# Patient Record
Sex: Female | Born: 1965 | Race: Black or African American | Hispanic: No | Marital: Married
Health system: Southern US, Community
[De-identification: ages and names within clinical notes are randomized; demographics above are authoritative.]

---

## 2018-02-21 ENCOUNTER — Emergency Department (HOSPITAL_COMMUNITY): Payer: 59

## 2018-02-21 ENCOUNTER — Emergency Department (HOSPITAL_COMMUNITY)
Admission: EM | Admit: 2018-02-21 | Discharge: 2018-02-21 | Disposition: A | Discharge status: Home / Self Care | Payer: 59 | Attending: Emergency Medicine | Admitting: Emergency Medicine

## 2018-02-21 ENCOUNTER — Other Ambulatory Visit: Payer: Self-pay

## 2018-02-21 ENCOUNTER — Encounter (HOSPITAL_COMMUNITY): Payer: Self-pay | Admitting: Radiology

## 2018-02-21 DIAGNOSIS — R109 Unspecified abdominal pain: Secondary | ICD-10-CM | POA: Diagnosis present

## 2018-02-21 LAB — CBC WITH DIFFERENTIAL/PLATELET
ABS IMMATURE GRANULOCYTES: 0.02 10*3/uL (ref 0.00–0.07)
BASOS PCT: 0 %
Basophils Absolute: 0 10*3/uL (ref 0.0–0.1)
Eosinophils Absolute: 0.3 10*3/uL (ref 0.0–0.5)
Eosinophils Relative: 4 %
HEMATOCRIT: 41.1 % (ref 36.0–46.0)
HEMOGLOBIN: 13 g/dL (ref 12.0–15.0)
IMMATURE GRANULOCYTES: 0 %
LYMPHS ABS: 2 10*3/uL (ref 0.7–4.0)
Lymphocytes Relative: 29 %
MCH: 29 pg (ref 26.0–34.0)
MCHC: 31.6 g/dL (ref 30.0–36.0)
MCV: 91.7 fL (ref 80.0–100.0)
MONO ABS: 0.5 10*3/uL (ref 0.1–1.0)
MONOS PCT: 8 %
NEUTROS ABS: 4 10*3/uL (ref 1.7–7.7)
Neutrophils Relative %: 59 %
PLATELETS: 243 10*3/uL (ref 150–400)
RBC: 4.48 MIL/uL (ref 3.87–5.11)
RDW: 11.9 % (ref 11.5–15.5)
WBC: 6.8 10*3/uL (ref 4.0–10.5)
nRBC: 0 % (ref 0.0–0.2)

## 2018-02-21 LAB — URINALYSIS, ROUTINE W REFLEX MICROSCOPIC
Bilirubin Urine: NEGATIVE
GLUCOSE, UA: NEGATIVE mg/dL
HGB URINE DIPSTICK: NEGATIVE
Ketones, ur: 20 mg/dL — AB
LEUKOCYTES UA: NEGATIVE
NITRITE: NEGATIVE
PH: 5 (ref 5.0–8.0)
Protein, ur: 30 mg/dL — AB
Specific Gravity, Urine: 1.025 (ref 1.005–1.030)

## 2018-02-21 LAB — BASIC METABOLIC PANEL
ANION GAP: 6 (ref 5–15)
BUN: 16 mg/dL (ref 6–20)
CHLORIDE: 110 mmol/L (ref 98–111)
CO2: 24 mmol/L (ref 22–32)
Calcium: 8.9 mg/dL (ref 8.9–10.3)
Creatinine, Ser: 0.89 mg/dL (ref 0.44–1.00)
GFR calc Af Amer: >60 mL/min (ref >60)
GFR calc non Af Amer: >60 mL/min (ref >60)
GLUCOSE: 76 mg/dL (ref 70–99)
POTASSIUM: 3.7 mmol/L (ref 3.5–5.1)
Sodium: 140 mmol/L (ref 135–145)

## 2018-02-21 LAB — HCG, QUANTITATIVE, PREGNANCY: hCG, Beta Chain, Quant, S: 2 m[IU]/mL (ref <5)

## 2018-02-21 MED ORDER — SODIUM CHLORIDE 0.9 % IV BOLUS
1000.0000 mL | Freq: Once | INTRAVENOUS | Status: AC
  Administered 2018-02-21: 1000 mL via INTRAVENOUS

## 2018-02-21 NOTE — ED Notes (Signed)
Ambulated to BR, made aware of delay with CT

## 2018-02-21 NOTE — ED Notes (Signed)
To CT

## 2018-02-21 NOTE — ED Triage Notes (Signed)
Pt reports feeling "kidney stone" symptoms at 0500. Pt reports nausea, faint, and flank pain.

## 2018-02-21 NOTE — ED Notes (Signed)
Waiting for discharge orders.

## 2018-02-21 NOTE — ED Notes (Signed)
Returned from CT.

## 2018-02-21 NOTE — ED Notes (Signed)
Urine culture sent down to lab  

## 2018-02-21 NOTE — ED Provider Notes (Signed)
[facility]-EMERGENCY DEPT Provider Note   CSN: [id number] Arrival date & time: 02/21/18  5284     History   Chief Complaint Chief Complaint  Patient presents with  . Flank Pain    HPI Tiffany Fowler is a 52 y.o. female presenting for left-sided flank pain that occurred approximately 1 hour prior to arrival.  Patient states that she was sleeping when she was awoken with a sharp left-sided flank pain that she states was 10/10 in severity. Patient states that this pain was accompanied by severe nausea however no vomiting. Patient states that this pain lasted approximately 5 minutes before resolving on its own.  Patient endorses total relief of pain, states that she is denying any pain at this time and feels well.  Patient states that she has a history of kidney stones and that this pain feels similar, states that her last stone was approximately 10 years ago.  Patient states that aside from history of nephrolithiasis and arthritis that she is an otherwise healthy female without other chronic medical conditions.  HPI  History reviewed. No pertinent past medical history.  There are no active problems to display for this patient.     OB History   None      Home Medications    Prior to Admission medications   Medication Sig Start Date End Date Taking? Authorizing Provider  aspirin 325 MG EC tablet Take 325 mg by mouth daily as needed (travel to prevent blood clots).   Yes [provider]  Cholecalciferol (VITAMIN D3) 5000 units CAPS Take 5,000 Units by mouth daily.   Yes [provider]  ibuprofen (ADVIL,MOTRIN) 200 MG tablet Take 400 mg by mouth 2 (two) times daily as needed for moderate pain.   Yes [provider]    Family History No family history on file.  Social History Social History   Tobacco Use  . Smoking status: Not on file  Substance Use Topics  . Alcohol use: Not on file  . Drug use: Not on file      Allergies   Patient has no known allergies.   Review of Systems Review of Systems  Constitutional: Negative.  Negative for chills and fever.  HENT: Negative.  Negative for rhinorrhea and sore throat.   Eyes: Negative.  Negative for visual disturbance.  Respiratory: Negative.  Negative for shortness of breath.   Cardiovascular: Negative.  Negative for chest pain.  Gastrointestinal: Positive for nausea. Negative for abdominal pain, diarrhea and vomiting.  Genitourinary: Positive for flank pain. Negative for dysuria and hematuria.  Musculoskeletal: Negative for arthralgias and myalgias.  Skin: Negative.  Negative for rash.  Neurological: Negative.  Negative for dizziness, weakness and headaches.   Physical Exam Updated Vital Signs BP 126/79 Comment: Resp 16  Pulse 81 Comment: Resp 16  Temp 97.6 F (36.4 C) (Oral)   Resp 20   Ht 5\' 8"  (1.727 m)   Wt 96.6 kg   LMP 11/11/2017 Comment: negative HCG blood test 02-21-2018  SpO2 98% Comment: Resp 16  BMI 32.39 kg/m   Physical Exam  Constitutional: She appears well-developed and well-nourished. No distress.  HENT:  Head: Normocephalic and atraumatic.  Right Ear: External ear normal.  Left Ear: External ear normal.  Nose: Nose normal.  Mouth/Throat: Uvula is midline, oropharynx is clear and moist and mucous membranes are normal.  Eyes: Pupils are equal, round, and reactive to light. EOM are normal.  Neck: Trachea normal, normal range of motion, full passive  range of motion without pain and phonation normal. Neck supple. No tracheal deviation present.  Cardiovascular: Normal rate, regular rhythm, normal heart sounds and intact distal pulses.  Pulses:      Dorsalis pedis pulses are 2+ on the right side, and 2+ on the left side.       Posterior tibial pulses are 2+ on the right side, and 2+ on the left side.  Pulmonary/Chest: Effort normal and breath sounds normal. No respiratory distress. She exhibits no tenderness, no crepitus  and no deformity.  Abdominal: Soft. Bowel sounds are normal. There is no tenderness. There is no rigidity, no rebound, no guarding and no CVA tenderness.  Musculoskeletal: Normal range of motion.       Right lower leg: Normal.       Left lower leg: Normal.  Neurological: She is alert. No sensory deficit. GCS eye subscore is 4. GCS verbal subscore is 5. GCS motor subscore is 6.  Speech is clear and goal oriented, follows commands Major Cranial nerves without deficit, no facial droop Normal strength in upper and lower extremities bilaterally including dorsiflexion and plantar flexion, strong and equal grip strength Sensation normal to light and sharp touch Moves extremities without ataxia, coordination intact Normal gait  Skin: Skin is warm and dry. Capillary refill takes less than 2 seconds.  Psychiatric: She has a normal mood and affect. Her behavior is normal.   ED Treatments / Results  Labs (all labs ordered are listed, but only abnormal results are displayed) Labs Reviewed  URINALYSIS, ROUTINE W REFLEX MICROSCOPIC - Abnormal; Notable for the following components:      Result Value   APPearance HAZY (*)    Ketones, ur 20 (*)    Protein, ur 30 (*)    Bacteria, UA FEW (*)    All other components within normal limits  URINE CULTURE  CBC WITH DIFFERENTIAL/PLATELET  BASIC METABOLIC PANEL  HCG, QUANTITATIVE, PREGNANCY    EKG None  Radiology Ct Renal Stone Study  Result Date: 02/21/2018 CLINICAL DATA:  Nausea and flank pain. EXAM: CT ABDOMEN AND PELVIS WITHOUT CONTRAST TECHNIQUE: Multidetector CT imaging of the abdomen and pelvis was performed following the standard protocol without IV contrast. COMPARISON:  None. FINDINGS: Lower chest: No acute abnormality. Hepatobiliary: No focal liver abnormality is seen. No gallstones, gallbladder wall thickening, or biliary dilatation. Pancreas: Unremarkable. No pancreatic ductal dilatation or surrounding inflammatory changes. Spleen: Normal in  size without focal abnormality. Adrenals/Urinary Tract: Adrenal glands are unremarkable. Kidneys are normal, without renal calculi, focal lesion, or hydronephrosis. Bladder is under distended. Stomach/Bowel: Stomach is within normal limits. Appendix appears normal. No evidence of bowel wall thickening, distention, or inflammatory changes. Mild sigmoid diverticulosis. Epiploic appendage arising from the descending colon without significant surrounding inflammatory changes. Vascular/Lymphatic: No significant vascular findings are present. No enlarged abdominal or pelvic lymph nodes. Reproductive: Uterus and bilateral adnexa are unremarkable. Other: Small fat containing umbilical hernia. No free fluid or pneumoperitoneum. Musculoskeletal: No acute or significant osseous findings. Small Schmorl's nodes involving the inferior L3 and superior L5 endplates. IMPRESSION: 1.  No acute intra-abdominal process.  No urolithiasis. Electronically Signed   By: Obie Dredge M.D.   On: 02/21/2018 08:54    Procedures Procedures (including critical care time)  Medications Ordered in ED Medications  sodium chloride 0.9 % bolus 1,000 mL (0 mLs Intravenous Stopped 02/21/18 0730)     Initial Impression / Assessment and Plan / ED Course  I have reviewed the triage vital signs and the  nursing notes.  Pertinent labs & imaging results that were available during my care of the patient were reviewed by me and considered in my medical decision making (see chart for details).  Clinical Course as of Feb 21 914  Mon Feb 21, 2018  1610 Patient reassessed, sitting comfortably and in no acute distress.  Patient denies recurrence of pain since 5AM.   [BM]  0907 Discussed case with Dr. Freida Busman.  Agrees that patient can be discharged with primary outpatient follow-up at this time.   [BM]    Clinical Course User Index [BM] Bill Salinas, PA-C   52 year old female presenting for 5 minutes of left flank pain and nausea that  occurred at 5 AM.  Beta hCG negative BMP within normal limits CBC within normal limits Urinalysis with some ketones and protein, few bacteria, no urinary symptoms doubt UTI at this time.  Has been sent for urine culture.  Likely dehydration, patient states that she has been working a lot recently and drinking less water.  Fluid bolus given in emergency department. CT renal stone study: IMPRESSION: 1.  No acute intra-abdominal process.  No urolithiasis.  Afebrile, not tachycardic, not hypotensive, well-appearing in no acute distress.  Patient sitting comfortably no acute distress.  Patient denies recurrence of pain since 5 AM.  Patient states that she does not require pain or nausea medicine today and that she will follow-up with her primary care provider as soon as possible.  At this time there does not appear to be any evidence of an acute emergency medical condition and the patient appears stable for discharge with appropriate outpatient follow up. Diagnosis was discussed with patient who verbalizes understanding of care plan and is agreeable to discharge. I have discussed return precautions with patient who verbalizes understanding of return precautions. Patient strongly encouraged to follow-up with their PCP. All questions answered.  Patient's case discussed with Dr. Freida Busman who agrees with plan to discharge with follow-up.   Note: Portions of this report may have been transcribed using voice recognition software. Every effort was made to ensure accuracy; however, inadvertent computerized transcription errors may still be present.  Final Clinical Impressions(s) / ED Diagnoses   Final diagnoses:  Left flank pain    ED Discharge Orders    None       Elizabeth Palau 02/21/18 9604    Derwood Kaplan, MD 02/21/18 2342

## 2018-02-21 NOTE — Discharge Instructions (Addendum)
Please return to the Emergency Department for any new or worsening symptoms or if your symptoms do not improve. Please be sure to follow up with your Primary Care Physician as soon as possible regarding your visit today. If you do not have a Primary Doctor please use the resources below to establish one. Your CT scan today did not show signs of kidney stone or other acute processes.  Please follow-up with your primary care provider as soon as possible for further evaluation.  Contact a health care provider if: Your pain is not controlled with medicine. You have new symptoms. Your pain gets worse. You have a fever. Your symptoms last longer than 2-3 days. You have trouble urinating or you are urinating very frequently. Get help right away if: You have trouble breathing or you are short of breath. Your pain returns Your abdomen hurts or it is swollen or red. You have nausea or vomiting. You feel faint or you pass out. You have blood in your urine.  Do not take your medicine if  develop an itchy rash, swelling in your mouth or lips, or difficulty breathing.   RESOURCE GUIDE  Chronic Pain Problems: Contact [facility]  [phone] Patients need to be referred by their primary care doctor.  Insufficient Money for Medicine: Contact United Way:  call "211" or [facility] 928-397-1197.  No Primary Care Doctor: Call [facility]  [phone] - can help you locate a primary care doctor that  accepts your insurance, provides certain services, etc. Physician Referral Service- [phone]  Agencies that provide inexpensive medical care: [facility]  [phone] [facility]  [phone] [facility]  [phone] [facility]  [phone] Planned Parenthood  [phone] [facility]  [phone]  Medicaid-accepting [facility] Providers: [facility]- [address]  [phone], Mon-Fri  9am-7pm, Sat 9am-1pm [facility]- [address] [place], [address] [place]  [phone] [facility]- [address], Suite [place]  [phone] [facility]- [address]  [phone] Renaye Rakers- [address] [place], [address], [phone]  Only accepts [place] patients after they have their name  applied to their card  Self Pay (no insurance) in [facility]: Sickle Cell Patients: Dr Willey Blade, [facility] Internal Medicine  [address] [place], [phone] [facility]- [address] [place]  [phone]       [facility]- [address]       -     [facility]- see information above (Speak to [employer] if you do not have insurance)       -  [facility]- [address] [place], [phone]       -  [facility]- [address],  [phone]       -  [facility]- [address] [phone]       -  Dr Julio Sicks-  [address], [address], [address], [phone]       -  [facility]- [address], [phone]       -  [facility]- [address], [phone], also [address] [phone]       -    [facility]- [address] [place], [phone], 1st & 3rd Saturday  every month, 10am-1pm  1) Find a Doctor and Pay Out of Pocket Although you won't have to find out who is covered by your insurance plan, it is a good idea to ask around and get recommendations. You will then need to call the office and see if the doctor you have chosen will accept you as a new patient and what types of options they offer for patients who are self-pay. Some doctors offer discounts or will set up payment plans for their patients who do not have insurance, but you will need to ask so you aren't surprised when you get to your appointment.  2) Contact Your Local Health Department Not all health departments have doctors that can see patients for  sick visits, but many do, so it is worth a call to see if yours does. If you don't know where your local health department is, you can check in your phone book. The CDC also has a tool to help you locate your state's health department, and many state websites also have listings of all of their local health departments.  3) Find a Walk-in Clinic If your illness is not likely to be very severe or complicated, you may want to try a walk in clinic. These are popping up all over the country in pharmacies, drugstores, and shopping centers. They're usually staffed by nurse practitioners or physician assistants that have been trained to treat common illnesses and complaints. They're usually fairly quick and inexpensive. However, if you have serious medical issues or chronic medical problems, these are probably not your best option  STD Testing [facility] Department of [facility] [place], STD Clinic, [address] [place], phone [phone] or [phone].  Monday - Friday, call for an appointment. [facility] Department of [employer], STD Clinic, [place] [address], [place], phone [phone] or [phone].  Monday - Friday, call for an appointment.  Abuse/Neglect: [facility] Child Abuse Hotline 515 146 9508 [facility] Child Abuse Hotline 661 586 7004 (After Hours)  Emergency Shelter:  [facility] 910-861-2294  Maternity Homes: Room at the [place] [facility] 306-707-8051 [facility] [phone]  MRSA Hotline #:   (609)455-0699  [facility] Resources  [facility] [place]  United Way [facility] Dept. [address]                 [address]         [address] [place] [address]  Blondell Reveal Phone:  [phone]                                  Phone:  [phone]                   Phone:   [phone]  [facility], [phone] [phone]       -     [facility] in  [place] [address],                                  [phone], Insurance  [place] Child Abuse Hotline (580)598-5768 or 442-095-1386 (After Hours)   Behavioral Health Services  Substance Abuse Resources: [facility]  4306123907 [facility] 3612680668 [facility] [phone] Floydene Flock [phone] Residential & Outpatient Substance Abuse Program  [phone]  Psychological Services: [facility]  [phone] [facility] Services  [phone] [facility], [phone] [place]. [address] [place], ACCESS LINE: 713-471-9128 or [phone], [employer]  Dental Assistance  If unable to pay or uninsured, contact:  [facility] or [facility]. to become qualified for the adult dental clinic.  Patients with Medicaid: [facility] [phone] [address] Quails, [phone] [address] [phone]  If unable to pay, or uninsured, contact [facility] [phone]) or [facility] Department [phone] in [place], [phone] in [facility]) to become qualified for the adult dental clinic   Other Low-Cost Community Dental Services: [facility]- [address] [place], [place], [place], [address], [phone], Ext. [phone], 2nd and 4th Thursday of the month at 6:30am.  10 clients each day by appointment, can sometimes see walk-in patients if someone does not show for an appointment. [facility]- [address] [place], [place], [address], [phone] [facility] [address] [place], [place], [address], [phone] [facility] Health Department- [phone] [facility] Health Department- [phone] [facility] [phone]

## 2018-02-23 LAB — URINE CULTURE

## 2020-05-11 IMAGING — CT CT RENAL STONE PROTOCOL
2 of 4 series · 16 of 46 positions shown, 18 images · non-contrast
Comparison: None.

CLINICAL DATA: Nausea and flank pain.

EXAM:
CT ABDOMEN AND PELVIS WITHOUT CONTRAST
TECHNIQUE: Multidetector CT imaging of the abdomen and pelvis was performed
following the standard protocol without IV contrast.

[Series 2: axial st · axial · 0.82mm/px · z∈[-506,-81]mm · 13 of 97 slices shown, 15 images]
[im 6/97  soft-tissue]
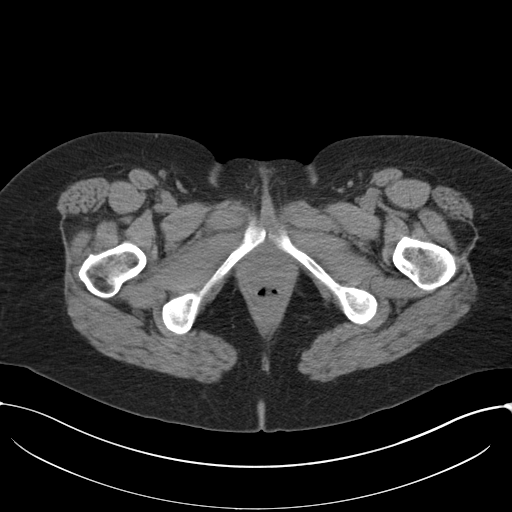
[im 6/97  bone]
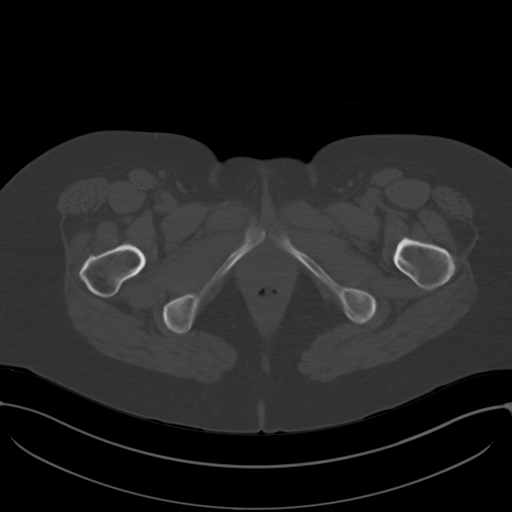
[im 12/97  soft-tissue]
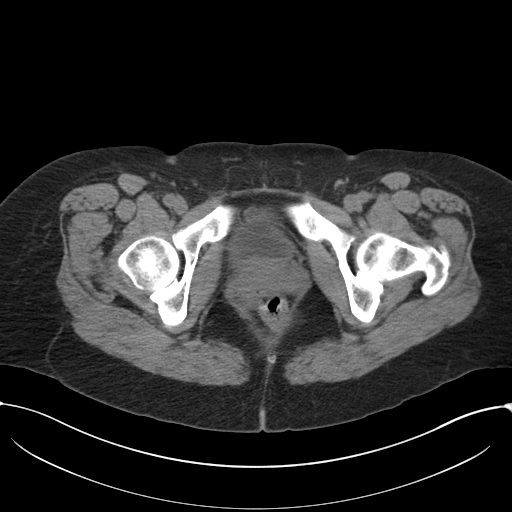
[im 23/97  soft-tissue]
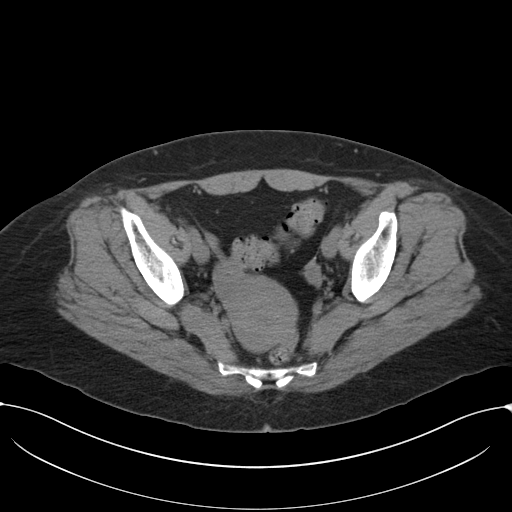
[im 29/97  soft-tissue]
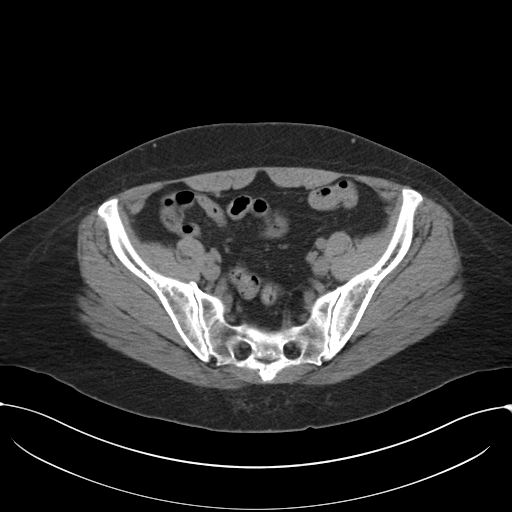
[im 34/97  soft-tissue]
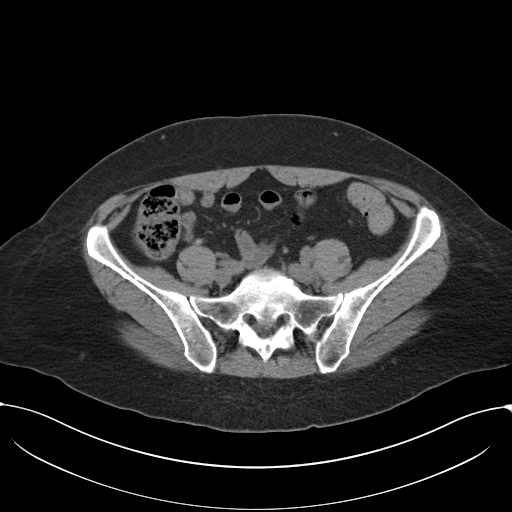
[im 40/97  soft-tissue]
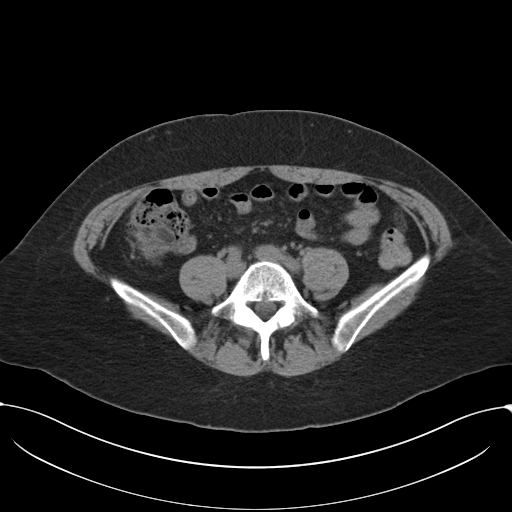
[im 51/97  soft-tissue]
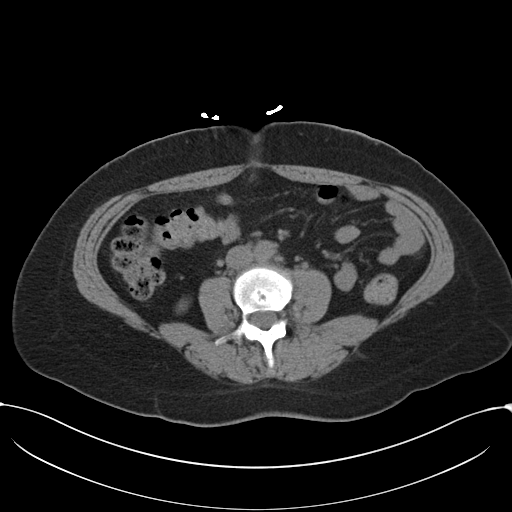
[im 57/97  soft-tissue]
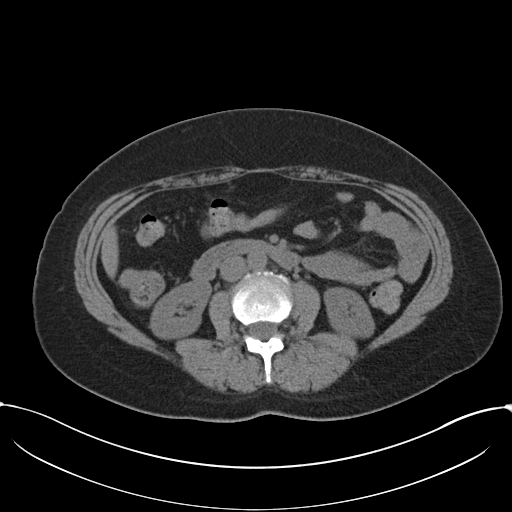
[im 63/97  soft-tissue]
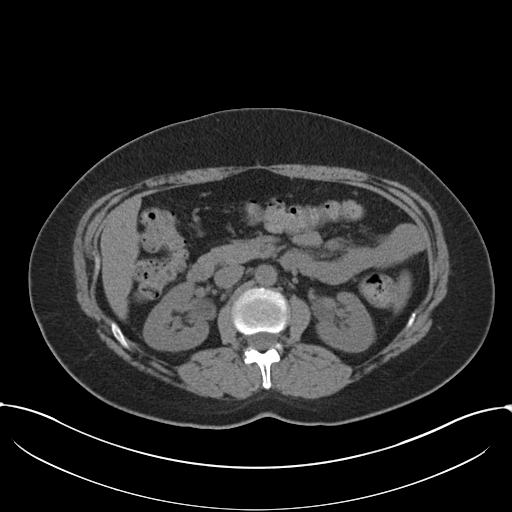
[im 63/97  bone]
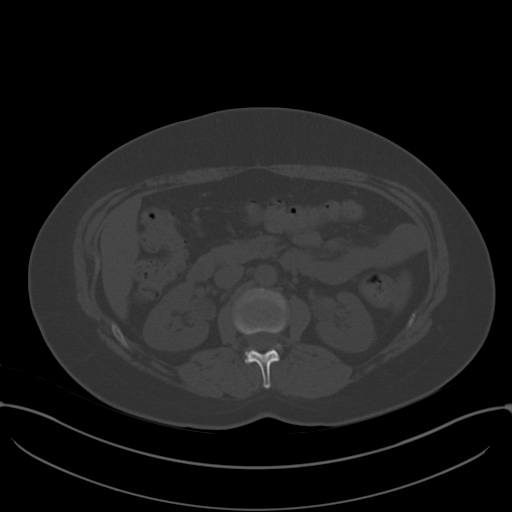
[im 68/97  soft-tissue]
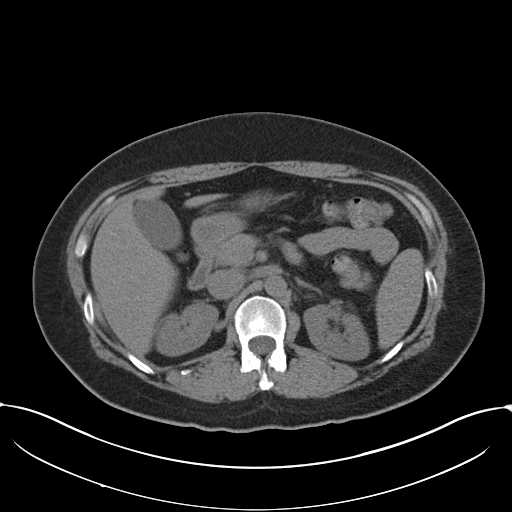
[im 74/97  soft-tissue]
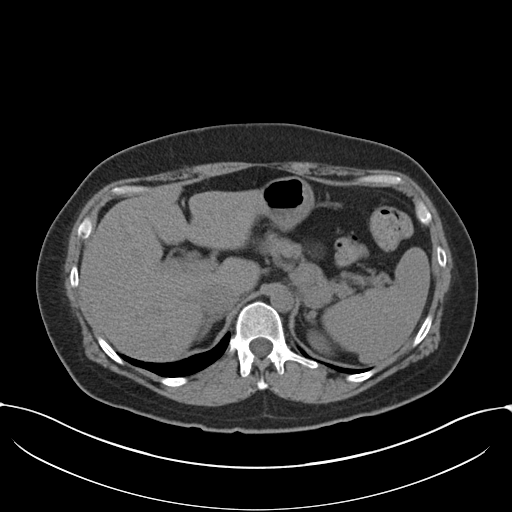
[im 85/97  soft-tissue]
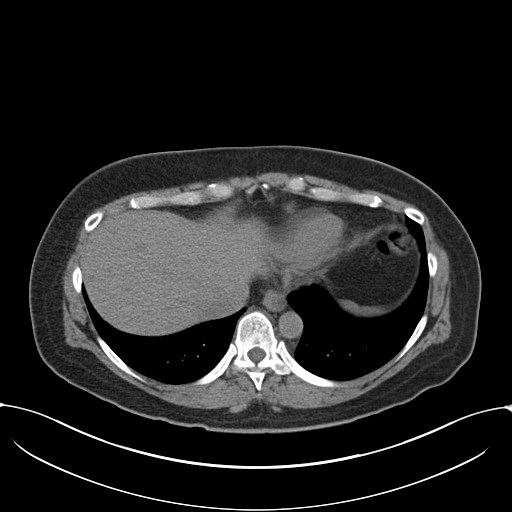
[im 91/97  soft-tissue]
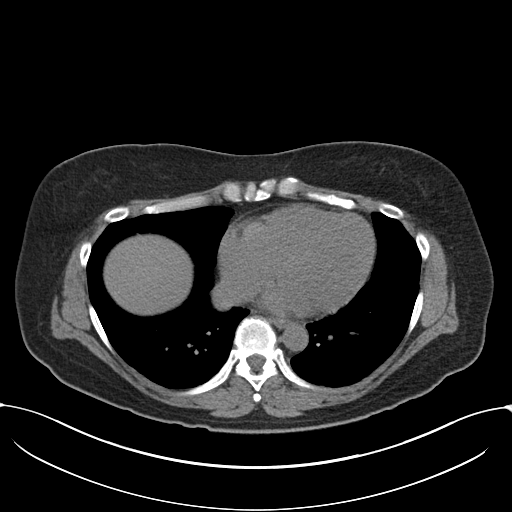

[Series 4: coronal · coronal · 0.82mm/px · 3 of 154 slices shown]
[im 52/154  soft-tissue]
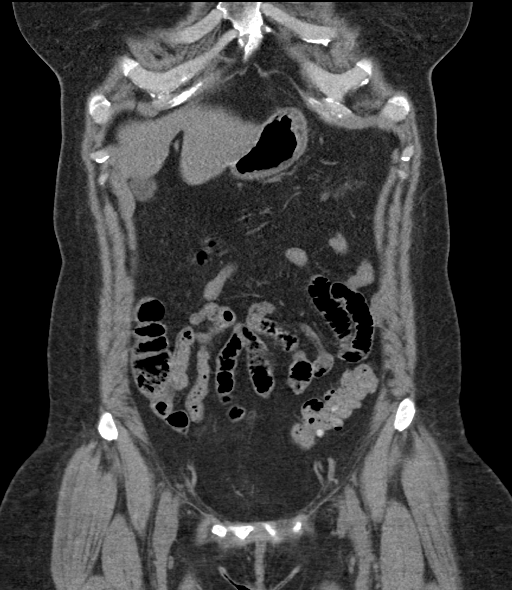
[im 69/154  soft-tissue]
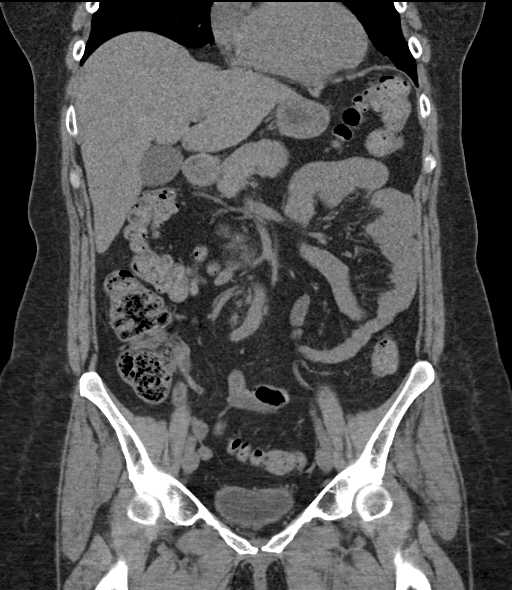
[im 86/154  soft-tissue]
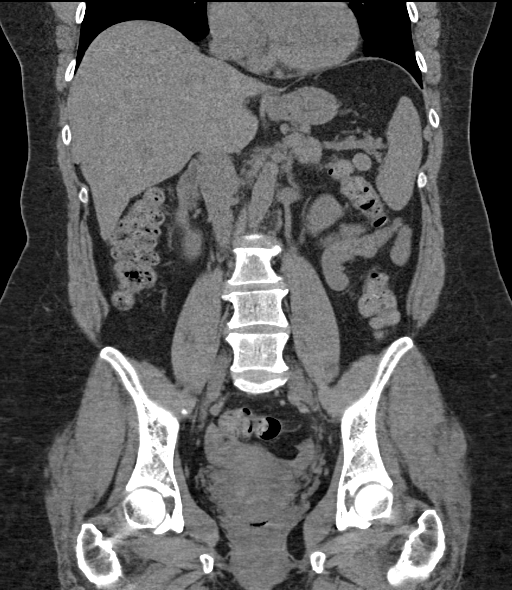

[16 of 46 positions shown; findings below may reference images not displayed]

FINDINGS: Lower chest: No acute abnormality.

Hepatobiliary: No focal liver abnormality is seen. No gallstones,
gallbladder wall thickening, or biliary dilatation.

Pancreas: Unremarkable. No pancreatic ductal dilatation or
surrounding inflammatory changes.

Spleen: Normal in size without focal abnormality.

Adrenals/Urinary Tract: Adrenal glands are unremarkable. Kidneys are
normal, without renal calculi, focal lesion, or hydronephrosis.
Bladder is under distended.

Stomach/Bowel: Stomach is within normal limits. Appendix appears
normal. No evidence of bowel wall thickening, distention, or
inflammatory changes. Mild sigmoid diverticulosis. Epiploic
appendage arising from the descending colon without significant
surrounding inflammatory changes.

Vascular/Lymphatic: No significant vascular findings are present. No
enlarged abdominal or pelvic lymph nodes.

Reproductive: Uterus and bilateral adnexa are unremarkable.

Other: Small fat containing umbilical hernia. No free fluid or
pneumoperitoneum.

Musculoskeletal: No acute or significant osseous findings. Small
Schmorl's nodes involving the inferior L3 and superior L5 endplates.
IMPRESSION: 1.  No acute intra-abdominal process.  No urolithiasis.
